# Patient Record
Sex: Male | Born: 2000 | Race: Black or African American | Hispanic: No | Marital: Single | Smoking: Never smoker
Health system: Southern US, Community
[De-identification: ages and names within clinical notes are randomized; demographics above are authoritative.]

## PROBLEM LIST (undated history)

## (undated) DIAGNOSIS — J45909 Unspecified asthma, uncomplicated: Secondary | ICD-10-CM

## (undated) HISTORY — PX: TESTICLE SURGERY: SHX794

---
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
## 2021-02-10 ENCOUNTER — Other Ambulatory Visit: Payer: Self-pay

## 2021-02-10 ENCOUNTER — Other Ambulatory Visit (HOSPITAL_COMMUNITY)
Admission: RE | Admit: 2021-02-10 | Discharge: 2021-02-10 | Disposition: A | Discharge status: Home / Self Care | Payer: Medicaid Other | Source: Ambulatory Visit | Attending: Pediatrics | Admitting: Pediatrics

## 2021-02-10 ENCOUNTER — Ambulatory Visit (INDEPENDENT_AMBULATORY_CARE_PROVIDER_SITE_OTHER): Payer: Medicaid Other | Admitting: Pediatrics

## 2021-02-10 VITALS — Temp 98.1°F | Wt 167.0 lb

## 2021-02-10 DIAGNOSIS — R1030 Lower abdominal pain, unspecified: Secondary | ICD-10-CM

## 2021-02-10 DIAGNOSIS — N5312 Painful ejaculation: Secondary | ICD-10-CM

## 2021-02-10 LAB — POCT URINALYSIS DIPSTICK
Bilirubin, UA: NEGATIVE
Blood, UA: NEGATIVE
Glucose, UA: NEGATIVE
Ketones, UA: NEGATIVE
Leukocytes, UA: NEGATIVE
Nitrite, UA: NEGATIVE
Protein, UA: POSITIVE — AB
Spec Grav, UA: 1.02 (ref 1.010–1.025)
Urobilinogen, UA: 0.2 E.U./dL
pH, UA: 7 (ref 5.0–8.0)

## 2021-02-10 NOTE — Progress Notes (Signed)
PCP: Hanvey, [place], MD   CC:  Penile pain   History was provided by the patient.   Subjective:  HPI:  Joshua Todd is a 20 y.o. male with a history of possible auditory processing disorder, ADHD, mild intermittent asthma who comes today for penile pain  The patient noticed pain in his penis with sex/ejaculation for the past 2 weeks.  His partner has also been complaining of pain with sex.  His last sexual intercourse was yesterday and was painful.  He has had mild pain when not having sex and only once during urination He denies discharge from penis Denies lesions sores or ulcers on penis Reports having penile-vaginal sex only Denies any trauma to the penis No previous history of STDs in the past Has had 2 partners over the past 1 month Not using condoms and reports that he would not be upset if his partner got pregnant Otherwise well  Of note, Has no showed most recent visits (Nov 21 with adolescent, Dec 21 with [facility])  REVIEW OF SYSTEMS: 10 systems reviewed and negative except as per HPI  Meds: Current Outpatient Medications  Medication Sig Dispense Refill  . albuterol (PROAIR HFA) 108 (90 Base) MCG/ACT inhaler Inhale 2 puffs into the lungs every 4 (four) hours as needed for wheezing or shortness of breath. 18 g 3  . cetirizine (ZYRTEC) 10 MG tablet Take 1 tablet (10 mg total) by mouth daily. 90 tablet 3  . fluticasone (FLONASE) 50 MCG/ACT nasal spray PLACE 1 SPRAY INTO BOTH NOSTRILS DAILY. 16 mL 1  . hydrOXYzine (ATARAX/VISTARIL) 10 MG tablet Take 1 tablet (10 mg total) by mouth 3 (three) times daily as needed. 30 tablet 0  . methylphenidate (CONCERTA) 36 MG PO CR tablet Take 1 tablet (36 mg total) by mouth daily. qam 31 tablet 0  . methylphenidate (CONCERTA) 36 MG PO CR tablet Take 1 tablet (36 mg total) by mouth daily. 30 tablet 0  . methylphenidate (CONCERTA) 36 MG PO CR tablet Take 1 tablet (36 mg total) by mouth daily. 31 tablet 0  . Multiple Vitamin tablet Take 1 tablet  by mouth daily. 30 tablet 12   No current facility-administered medications for this visit.    ALLERGIES: No Known Allergies  PMH: No past medical history on file.  Problem List:  Patient Active Problem List   Diagnosis Date Noted  . Sleep concern 08/03/2020  . Ringing in ears, bilateral 08/03/2020  . BMI (body mass index), pediatric, 5% to less than 85% for age 03/03/2020  . Central auditory processing disorder 08/03/2020  . Failed hearing screening 08/03/2020  . Vaccine refused by parent 08/03/2020  . Impacted cerumen of right ear 05/03/2018  . Mild intermittent asthma without complication 05/03/2018  . Seasonal allergic rhinitis 05/03/2018  . Adjustment disorder with mixed anxiety and depressed mood 02/14/2017  . Acne 06/02/2016  . Constipation 03/13/2014  . ADHD (attention deficit hyperactivity disorder), combined type 08/13/2013  . Learning disability 08/13/2013  . 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  Social history:  Social History   Social History Narrative  . Not on file    Family history: No family history on file.   Objective:   Physical Examination:  Temp: 98.1 F (36.7 C) (Temporal) Wt: 167 lb (75.8 kg)  GENERAL: Well appearing, no distress GU: Normal penis and testicles, no lesions, ulcers, discharges or sores SKIN: No rash  Urine dipstick: positive for protein,  otherwise negative for nitrate, leukocyte, blood Urine GC and chlamydia pending  Assessment:  Joshua Todd is a 20 y.o. old male here with dyspareunia.  Exam is normal urine dipstick is normal.  Most common etiology would be STD, urine GC/chlamydia are pending.   Plan:   1.Dyspareunia -Urine GC and chlamydia pending and most likely etiologies.  If negative, then will consider other etiologies -Given many condoms today and reviewed importance of use for both STD and pregnancy prevention  Follow up: as  needed, will call patient with pending results to number [phone]   Renato Gails, MD [facility] 02/10/2021  7:30 PM

## 2021-02-12 LAB — URINE CYTOLOGY ANCILLARY ONLY
Chlamydia: NEGATIVE
Comment: NEGATIVE
Comment: NORMAL
Neisseria Gonorrhea: NEGATIVE

## 2021-02-13 NOTE — Addendum Note (Signed)
Addended by: Roxy Horseman on: 02/13/2021 01:32 PM   Modules accepted: Orders

## 2021-02-13 NOTE — Progress Notes (Signed)
On 3/11 the patient was updated by phone re the negative gonorrhea and chlamydia results.  Also of note UA was negative for blood, nitrites and leukocytes.  Positive for protein, but obtained late in afternoon (will need first AM urine in future to confirm that he does not have persistent proteinuria. Patient is still having pain with ejaculation/intercourse.  Will plan to refer to urology.  The patient is aware and agrees with this plan.  He noted that his mom is also aware (in case clinic calls to schedule) Joshua Blanco MD

## 2021-03-19 ENCOUNTER — Encounter: Payer: Self-pay | Admitting: Developmental - Behavioral Pediatrics

## 2021-04-03 ENCOUNTER — Encounter: Payer: Self-pay | Admitting: Pediatrics

## 2021-04-03 ENCOUNTER — Other Ambulatory Visit: Payer: Self-pay

## 2021-04-03 ENCOUNTER — Ambulatory Visit (INDEPENDENT_AMBULATORY_CARE_PROVIDER_SITE_OTHER): Payer: Medicaid Other | Admitting: Pediatrics

## 2021-04-03 VITALS — BP 110/60 | HR 62 | Temp 97.0°F | Ht 72.0 in | Wt 166.6 lb

## 2021-04-03 DIAGNOSIS — F902 Attention-deficit hyperactivity disorder, combined type: Secondary | ICD-10-CM | POA: Diagnosis not present

## 2021-04-03 DIAGNOSIS — R4689 Other symptoms and signs involving appearance and behavior: Secondary | ICD-10-CM

## 2021-04-03 DIAGNOSIS — F4323 Adjustment disorder with mixed anxiety and depressed mood: Secondary | ICD-10-CM | POA: Diagnosis not present

## 2021-04-03 DIAGNOSIS — Z7689 Persons encountering health services in other specified circumstances: Secondary | ICD-10-CM

## 2021-04-03 DIAGNOSIS — H9325 Central auditory processing disorder: Secondary | ICD-10-CM

## 2021-04-03 DIAGNOSIS — F819 Developmental disorder of scholastic skills, unspecified: Secondary | ICD-10-CM | POA: Diagnosis not present

## 2021-04-03 DIAGNOSIS — R809 Proteinuria, unspecified: Secondary | ICD-10-CM

## 2021-04-03 DIAGNOSIS — J302 Other seasonal allergic rhinitis: Secondary | ICD-10-CM

## 2021-04-03 NOTE — Patient Instructions (Addendum)
[facility]  [email or web address]  [facility]  [email or web address]  [facility] [employer]  [email or web address]   Early morning urine: - Pee before bed.  Completely empty your bladder. - When you wake up, immediately go to the bathroom and pee in the sterile urine cup we gave you.  Close the lid tightly.  - Bring to clinic and deliver to front desk right away.   If you can't come right away, you can put it into the fridge for 4 hours.  After that, the sample is no longer good.
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
## 2021-04-04 DIAGNOSIS — R809 Proteinuria, unspecified: Secondary | ICD-10-CM | POA: Insufficient documentation

## 2021-04-04 DIAGNOSIS — R4689 Other symptoms and signs involving appearance and behavior: Secondary | ICD-10-CM | POA: Insufficient documentation

## 2021-04-04 MED ORDER — FLUTICASONE PROPIONATE 50 MCG/ACT NA SUSP: 1.0000 | Freq: Every day | NASAL | 1 refills | Status: DC

## 2021-04-04 MED ORDER — CETIRIZINE HCL 10 MG PO TABS: 10.0000 mg | ORAL_TABLET | Freq: Every day | ORAL | 3 refills | Status: DC

## 2021-06-27 ENCOUNTER — Other Ambulatory Visit: Payer: Self-pay | Admitting: Pediatrics

## 2021-06-27 DIAGNOSIS — J302 Other seasonal allergic rhinitis: Secondary | ICD-10-CM

## 2021-07-02 ENCOUNTER — Other Ambulatory Visit: Payer: Self-pay

## 2021-07-02 ENCOUNTER — Emergency Department (HOSPITAL_COMMUNITY): Payer: Medicaid Other

## 2021-07-02 ENCOUNTER — Emergency Department (HOSPITAL_COMMUNITY)
Admission: EM | Admit: 2021-07-02 | Discharge: 2021-07-03 | Disposition: A | Discharge status: Home / Self Care | Payer: Medicaid Other | Attending: Emergency Medicine | Admitting: Emergency Medicine

## 2021-07-02 DIAGNOSIS — Z23 Encounter for immunization: Secondary | ICD-10-CM | POA: Insufficient documentation

## 2021-07-02 DIAGNOSIS — S6291XA Unspecified fracture of right wrist and hand, initial encounter for closed fracture: Secondary | ICD-10-CM | POA: Insufficient documentation

## 2021-07-02 DIAGNOSIS — M7989 Other specified soft tissue disorders: Secondary | ICD-10-CM | POA: Diagnosis not present

## 2021-07-02 DIAGNOSIS — S6991XA Unspecified injury of right wrist, hand and finger(s), initial encounter: Secondary | ICD-10-CM | POA: Diagnosis present

## 2021-07-02 DIAGNOSIS — S62101A Fracture of unspecified carpal bone, right wrist, initial encounter for closed fracture: Secondary | ICD-10-CM

## 2021-07-02 DIAGNOSIS — J4520 Mild intermittent asthma, uncomplicated: Secondary | ICD-10-CM | POA: Insufficient documentation

## 2021-07-02 DIAGNOSIS — S62111A Displaced fracture of triquetrum [cuneiform] bone, right wrist, initial encounter for closed fracture: Secondary | ICD-10-CM | POA: Diagnosis not present

## 2021-07-02 NOTE — ED Triage Notes (Signed)
Patient here after a fall last week.  Patient states that he fell while skateboarding.  Patient has a wound on the palm of his hand.  Patient does have hand swelling and patient tried to bandage hand at home last week.  No drainage from wound at this time.

## 2021-07-03 ENCOUNTER — Emergency Department (HOSPITAL_COMMUNITY): Payer: Medicaid Other

## 2021-07-03 DIAGNOSIS — S62111A Displaced fracture of triquetrum [cuneiform] bone, right wrist, initial encounter for closed fracture: Secondary | ICD-10-CM | POA: Diagnosis not present

## 2021-07-03 DIAGNOSIS — S6291XA Unspecified fracture of right wrist and hand, initial encounter for closed fracture: Secondary | ICD-10-CM | POA: Diagnosis not present

## 2021-07-03 MED ORDER — DOXYCYCLINE HYCLATE 100 MG PO TABS
100.0000 mg | ORAL_TABLET | Freq: Once | ORAL | Status: AC
  Administered 2021-07-03: 100 mg via ORAL
  Filled 2021-07-03: qty 1

## 2021-07-03 MED ORDER — DOXYCYCLINE HYCLATE 100 MG PO CAPS: 100.0000 mg | ORAL_CAPSULE | Freq: Two times a day (BID) | ORAL | 0 refills | Status: DC

## 2021-07-03 MED ORDER — TETANUS-DIPHTH-ACELL PERTUSSIS 5-2.5-18.5 LF-MCG/0.5 IM SUSY
0.5000 mL | PREFILLED_SYRINGE | Freq: Once | INTRAMUSCULAR | Status: AC
  Administered 2021-07-03: 0.5 mL via INTRAMUSCULAR
  Filled 2021-07-03: qty 0.5
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
## 2021-07-03 NOTE — Discharge Instructions (Addendum)
Follow up with ortho in the office.   Return for rapid spreading redness, fever

## 2021-07-03 NOTE — Progress Notes (Signed)
Orthopedic Tech Progress Note Patient Details:  Joshua Todd [birth year] [id number]  Ortho Devices Type of Ortho Device: Sugartong splint, Shoulder immobilizer Ortho Device/Splint Location: RUE Ortho Device/Splint Interventions: Ordered, Application, Adjustment   Post Interventions Patient Tolerated: Well Instructions Provided: Adjustment of device, Care of device, Poper ambulation with device  [name] 07/03/2021, 5:12 AM

## 2021-07-06 ENCOUNTER — Telehealth: Payer: Self-pay

## 2021-07-06 NOTE — Telephone Encounter (Signed)
Transition Care Management Unsuccessful Follow-up Telephone Call  Date of discharge and from where:  07/03/2021-[facility]  Attempts:  1st Attempt  Reason for unsuccessful TCM follow-up call:  Unable to reach patient

## 2021-07-07 NOTE — Telephone Encounter (Signed)
Transition Care Management Unsuccessful Follow-up Telephone Call  Date of discharge and from where:  07/03/2021-[facility]   Attempts:  2nd Attempt  Reason for unsuccessful TCM follow-up call:  Voice mail full

## 2021-07-08 NOTE — Telephone Encounter (Signed)
Transition Care Management Unsuccessful Follow-up Telephone Call  Date of discharge and from where:  07/03/2021-[facility]  Attempts:  3rd Attempt  Reason for unsuccessful TCM follow-up call:  Voice mail full    

## 2021-08-07 ENCOUNTER — Other Ambulatory Visit: Payer: Self-pay

## 2021-08-07 ENCOUNTER — Other Ambulatory Visit (HOSPITAL_COMMUNITY)
Admission: RE | Admit: 2021-08-07 | Discharge: 2021-08-07 | Disposition: A | Discharge status: Home / Self Care | Payer: Medicaid Other | Source: Ambulatory Visit | Attending: Pediatrics | Admitting: Pediatrics

## 2021-08-07 ENCOUNTER — Ambulatory Visit (INDEPENDENT_AMBULATORY_CARE_PROVIDER_SITE_OTHER): Payer: Medicaid Other | Admitting: Pediatrics

## 2021-08-07 ENCOUNTER — Encounter: Payer: Self-pay | Admitting: Pediatrics

## 2021-08-07 VITALS — BP 98/58 | HR 63 | Ht 71.75 in | Wt 161.2 lb

## 2021-08-07 DIAGNOSIS — Z658 Other specified problems related to psychosocial circumstances: Secondary | ICD-10-CM

## 2021-08-07 DIAGNOSIS — F819 Developmental disorder of scholastic skills, unspecified: Secondary | ICD-10-CM | POA: Diagnosis not present

## 2021-08-07 DIAGNOSIS — Z113 Encounter for screening for infections with a predominantly sexual mode of transmission: Secondary | ICD-10-CM | POA: Insufficient documentation

## 2021-08-07 DIAGNOSIS — Z0001 Encounter for general adult medical examination with abnormal findings: Secondary | ICD-10-CM | POA: Diagnosis not present

## 2021-08-07 DIAGNOSIS — S62164G Nondisplaced fracture of pisiform, right wrist, subsequent encounter for fracture with delayed healing: Secondary | ICD-10-CM

## 2021-08-07 DIAGNOSIS — Z68.41 Body mass index (BMI) pediatric, 5th percentile to less than 85th percentile for age: Secondary | ICD-10-CM | POA: Diagnosis not present

## 2021-08-07 DIAGNOSIS — L02413 Cutaneous abscess of right upper limb: Secondary | ICD-10-CM

## 2021-08-07 DIAGNOSIS — Z114 Encounter for screening for human immunodeficiency virus [HIV]: Secondary | ICD-10-CM

## 2021-08-07 DIAGNOSIS — R809 Proteinuria, unspecified: Secondary | ICD-10-CM | POA: Diagnosis not present

## 2021-08-07 DIAGNOSIS — F902 Attention-deficit hyperactivity disorder, combined type: Secondary | ICD-10-CM | POA: Diagnosis not present

## 2021-08-07 DIAGNOSIS — J302 Other seasonal allergic rhinitis: Secondary | ICD-10-CM | POA: Diagnosis not present

## 2021-08-07 DIAGNOSIS — J4520 Mild intermittent asthma, uncomplicated: Secondary | ICD-10-CM | POA: Diagnosis not present

## 2021-08-07 DIAGNOSIS — S62151G Displaced fracture of hook process of hamate [unciform] bone, right wrist, subsequent encounter for fracture with delayed healing: Secondary | ICD-10-CM

## 2021-08-07 DIAGNOSIS — Z00121 Encounter for routine child health examination with abnormal findings: Secondary | ICD-10-CM

## 2021-08-07 DIAGNOSIS — Z7689 Persons encountering health services in other specified circumstances: Secondary | ICD-10-CM

## 2021-08-07 LAB — POCT RAPID HIV: Rapid HIV, POC: NEGATIVE

## 2021-08-07 MED ORDER — CLINDAMYCIN HCL 150 MG PO CAPS: 150.0000 mg | ORAL_CAPSULE | Freq: Three times a day (TID) | ORAL | 0 refills | Status: AC

## 2021-08-07 MED ORDER — METHYLPHENIDATE HCL ER (OSM) 36 MG PO TBCR: 36.0000 mg | EXTENDED_RELEASE_TABLET | Freq: Every day | ORAL | 0 refills | Status: DC

## 2021-08-07 MED ORDER — CLINDAMYCIN HCL 300 MG PO CAPS: 300.0000 mg | ORAL_CAPSULE | Freq: Three times a day (TID) | ORAL | 0 refills | Status: AC

## 2021-08-07 NOTE — Patient Instructions (Addendum)
Thanks for letting me take care of you and your family.  It was a pleasure seeing you today.  Here's what we discussed:  We will restart ADHD medications today.  We will have you follow-up in one month to make sure you are tolerating this medication without side effects.  Once we are on a stable dose, we will follow-up every 3 months.   I will send an antibiotic to your pharmacy for possible skin infection.  Please start this antibiotic today.  You will take both the 150 mg capsule and the 300 mg capsule for a combined dose of 450 mg.  Take this medication THREE times per day (about every 8 hours) for FIVE days.  Do not stop the medication early.  We will have you follow-up next week for recheck.   I will send a referral to an orthopedic hand surgeon.  Joshua Todd may benefit from physical therapy but I would like to make sure there is no additional inflammation/infection before we start this.    Please call our office if you have any new fever over 101F.

## 2021-08-07 NOTE — Progress Notes (Signed)
Adolescent Well Care Visit Joshua Todd is a 20 y.o. male who is here for well care.     PCP:  Lafern Brinkley, [place], MD   History was provided by the patient and mother.   Confidentiality was discussed with the patient and, if applicable, with caregiver.  Patient's personal phone number: [phone]   Current Issues:  R wrist fracture and hand laceration - Patient states that he sustained fall off skateboard 07/02/21 and that he went to ED and they started him on medication, but "hand has gotten worse again."  Reviewed ED visit notes 7/28 (sensitive note, had to "break the glass" for patient care) - exam notable for pain and swelling to R hand w/two lacerations (old in appearance) over palmar aspect fourth and fifth digit.  Plain film with concern for triquetrum and hamate fx.  "Discussed with Dr. Dion SaucierLandau, recommends splint, CT and will follow-up in the office."   - CT right wrist showed "severely comminuted fracture of the distal aspect of the pisiform with multiple 1-2 mm fracture fragments.  A few small fracture fragments project distal from the fracture in the soft tissues at the palmar aspect of the fifth metacarpal base.  Minimally displaced fracture of the hook of the hamate." - Given TDaP and started on doxycycline 100 mg BID to complete 7-day course.  - Per Mom, he "refused to follow-up with Ortho."   Refused to come see PCP.    Chronic Conditions:  History of proteinuria in March 2022.  Plan was for first morning sample, but never returned with provided urine cup.  Denies any recent urine changes.  No gross blood in urine.   Urine collected today at end of visit.  UA requested but not completed (only provided non clean-catch sample for urine GC/CT).   Mild intermittent asthma - well controlled with PRN albuterol use.  Has not used albuterol in a long time.   Allergic rhinitis - managed on Flonase, cetirizine. Prev advised to increase cetirizine to BID for next 1-2 mo and then return  to daily dosing and Flonase to BID.  Needs refills.   Learning disability - graduated in Spring 2022. Received specialized instruction in math and reading.  Wanted to join [employer]ines but never applied.  Interested in employment resources.  No plans to attend any post-secondary education.   Constipation - well-controlled   CAPD   Aggressive behavior   ADHD, combined type - Not currently on any medication.  Prev managed on Concerta 36 mg daily.  Followed by Adolescent team.  Concerta last prescribed in Aug 2021 (5141-month supply), but family says they never picked up the Rx.    Mood concern - Previously encouraged him to get involved with some type of healthy social teen group after graduation to avoid isolation.  Consulted with social work and behavioral health -- but did not identify resource for men 18 years and up.  Sent link to [employer] summer programming, but he never looked into these options.    Nutrition: Nutrition/Eating Behaviors: enormous appetite  Adequate calcium in diet?: yes Supplements/ Vitamins: no  Exercise/ Media: Play any Sports?:  none Exercise:   works out at gym multiple times per week - previously lifting weights but less since hand injury  Screen Time:   well over 2 hours per day -- counseling provided.  Has own phone.    Sleep:  Sleep: 8-10 hours/day, bedtime by 10 pm. Sometimes sleeps in afternoons - counseling provided  Sleep apnea symptoms:  no   Social Screening: Lives with: mother Parental relations:   issues regarding limit setting; gets angry easily with Mom but she helps him calm and take a step back; no physical aggression towards mom  Activities, Work, and Chores?: Mom has trouble getting Joshua Todd to help with chores at home.  Interested in employment resources [employer] only partially interested)  Concerns regarding behavior with peers?  Prior concerns for easy aggression with peers.  No current peer group.  Does stay in touch some with  older sister who lives locally in the [place] area  Education: School name: graduated [employer] in 2022  No plans for post-secondary education   Menstruation:   Not applicable   Dental Assessment: Patient has a dental home: yes - due for follow-up   Confidential social history: Tobacco?  no Secondhand smoke exposure?  no Drugs/ETOH?  no  Sexually Active?  yes   Pregnancy Prevention: intermittent condom use   Safe at home, in school & in relationships? Prior concern for aggressive behavior with peers; easily frustrated  Safe to self?  Yes  Screenings:  The patient completed the Rapid Assessment for Adolescent Preventive Services screening questionnaire and the following topics were identified as risk factors and discussed: condom use, mental health issues, social isolation, and anger   In addition, the following topics were discussed as part of anticipatory guidance: pregnancy prevention, depression/anxiety.  PHQ-9 completed and results indicated score 6 - concern for mild depression.  Symptoms make it "extremely difficult."   Physical Exam:  Vitals:   08/07/21 1416  BP: (!) 98/58  Pulse: 63  SpO2: 95%  Weight: 161 lb 3.2 oz (73.1 kg)  Height: 5' 11.75" (1.822 m)   BP (!) 98/58 (BP Location: Left Arm, Patient Position: Sitting, Cuff Size: Normal) Comment (BP Location): right arm in pain  Pulse 63   Ht 5' 11.75" (1.822 m)   Wt 161 lb 3.2 oz (73.1 kg)   SpO2 95%   BMI 22.02 kg/m  Body mass index: body mass index is 22.02 kg/m. Blood pressure percentiles are not available for patients who are 18 years or older.  Hearing Screening  Method: Audiometry   500Hz  1000Hz  2000Hz  4000Hz   Right ear 20 20 20 20   Left ear 20 20 20 20    Vision Screening   Right eye Left eye Both eyes  Without correction 20/20 20/20   With correction       General: well developed, no acute distress, gait normal, intermittent eye contact, quiet speech, limited interaction  HEENT: PERRL,  normal oropharynx, TMs normal bilaterally Neck: supple, no lymphadenopathy CV: RRR no murmur noted PULM: normal aeration throughout all lung fields, no crackles or wheezes Abdomen: soft, non-tender; no masses or HSM Extremities: warm and well perfused Right hand - Mild erythema at sites of two prior lacerations over palm -- no drainage or fluctuance.  Flinches with gentle touch and palpation of ulnar aspect and palmar surface (ulnar > left).  States that he "flinches" because he doesn't like the "feeling of other people's touch on his hand" but that it is "not painful."  Unable to fully MCP and IP joints.  Unable to fully flex MCP and IP joints to form fist.  Decreased strength and ROM with thumb abduction and opposition.    Skin - small dime-sized area of erythema and fluctuance over ventral surface of distal right forearm.  No streaking erythema towards wrist.  Warm and tender to palpation. GU: Normal male external genitalia and Testes  descended bilaterally. Exam completed with chaperone present.  Skin:scattered comedones Neuro: alert and oriented, moves all extremities equally   Assessment and Plan:  Dante Roudebush is a 20 y.o. male who is here for well care.   Encounter for routine child health examination with abnormal findings  BMI (body mass index), pediatric, 5% to less than 85% for age  Mild intermittent asthma without complication Well controlled.  Provided refills.  -     albuterol (PROAIR HFA) 108 (90 Base) MCG/ACT inhaler; Inhale 2 puffs into the lungs every 4 (four) hours as needed for wheezing or shortness of breath.  Seasonal allergies Well controlled.  Provided refills.  -     cetirizine (ZYRTEC) 10 MG tablet; Take 1 tablet (10 mg total) by mouth daily. -     fluticasone (FLONASE) 50 MCG/ACT nasal spray; Place 1 spray into both nostrils daily as needed for allergies or rhinitis.  ADHD (attention deficit hyperactivity disorder), combined type Concern for  poorly-controlled impulsive behavior. Mom and patient both requesting ADHD meds.  Will plan to restart ADHD medications at prior dose with close 1-mo follow-up to ensure tolerating medication with good effect.  Provided 1 mo supply.   -     methylphenidate (CONCERTA) 36 MG PO CR tablet; Take 1 tablet (36 mg total) by mouth daily.  Learning disability Completed HS.  Discussed postsecondary education today, including vocational training, but patient not interested.   Proteinuria, unspecified type Unable to collect clean catch urine today for repeat UA to assess for persistent proteinuria.  Recheck at f/u visit next week.    Cutaneous abscess of right upper extremity Concern for small abscess over ventral surface of R distal forearm.  Will treat with clindamycin TID for 5-day course w/close follow-up planned for Mon 9/6.  BP slightly low for age, but concern for sepsis low in absence of fever and other systemic symptoms.  Exam is also notable for prior two hand lacerations over palmar surface which have mild erythema.  No streaking erythema to suggest lymphangiitis.   -     clindamycin (CLEOCIN) 300 MG capsule; Take 1 capsule (300 mg total) by mouth 3 (three) times daily for 5 days. Take with 150 mg capsule for combined dose of 450 mg. - Recheck Mon 9/6  - Attempted to reach patient after visit at preferred number to ensure he had picked up medication and started it.  No answer and unable to leave VM.  Called another number previously listed as personal phone number-- no answer.    Closed nondisplaced fracture of pisiform of right wrist with delayed healing, subsequent encounter Closed displaced fracture of hook process of hamate of right wrist with delayed healing, subsequent encounter Concern for inappropriate healing and resulting decreased hand function.  Recommend urgent follow-up with Hand Surgery (patient previously refused Ortho follow-up after ED visit).  Emphasized importance of this  follow-up and long-term consequences if not treated.  -     Ambulatory referral to Orthopedics - Hand Surgery   Psychosocial stressors  Interested in employment resources.  Will route to [employer] to follow up with family  Sleep concern Increased need for sleep likely related to poorly controlled depression or other mood disorder.  - Did not discuss at length due to multiple other concerns today  - Declines referral to behavioral health   Well teen: -Growth: BMI is appropriate for age - downtrending in setting of intentional weight loss  -Development: prior concern for learning disability   -Social-Emotional: history  and PHQ-9A concerning for mild depression - declines referral to behavioral health  -Discussed anticipatory guidance including pregnancy/STI prevention, alcohol/drug use, screen time limits -Hearing screening result:normal - history of CAPD -Vision screening result: normal -STI screening completed -Blood pressure: slightly low for age - no concern for sepsis today  -Discuss transition to family medicine at next visit.  Multiple concerns today requiring follow-up with me -- not discussed   Need for vaccination:  -Counseling provided for all vaccine components  Due for Menactra #2 today.  Out of stock.  Provide at 1 mo f/u for ADHD.   Previously declined Mening B.  Had intended to join marines, but no longer interested. Recommend if new interest in [employer] or dormitory living.    Return for f/u 9/5 or 9/6 for hand recheck - yellow pod ok; 1 mo ADHD/mood f/u me -30 min .Marland Kitchen  Enis Gash, MD [facility]

## 2021-08-11 ENCOUNTER — Ambulatory Visit: Payer: Medicaid Other

## 2021-08-11 LAB — URINE CYTOLOGY ANCILLARY ONLY
Chlamydia: NEGATIVE
Comment: NEGATIVE
Comment: NORMAL
Neisseria Gonorrhea: NEGATIVE

## 2021-08-15 DIAGNOSIS — S62164A Nondisplaced fracture of pisiform, right wrist, initial encounter for closed fracture: Secondary | ICD-10-CM | POA: Insufficient documentation

## 2021-08-15 DIAGNOSIS — S62151G Displaced fracture of hook process of hamate [unciform] bone, right wrist, subsequent encounter for fracture with delayed healing: Secondary | ICD-10-CM | POA: Insufficient documentation

## 2021-08-15 MED ORDER — ALBUTEROL SULFATE HFA 108 (90 BASE) MCG/ACT IN AERS: 2.0000 | INHALATION_SPRAY | RESPIRATORY_TRACT | 1 refills | Status: AC | PRN

## 2021-08-15 MED ORDER — FLUTICASONE PROPIONATE 50 MCG/ACT NA SUSP: 1.0000 | Freq: Every day | NASAL | 2 refills | Status: DC | PRN

## 2021-08-15 MED ORDER — CETIRIZINE HCL 10 MG PO TABS
10.0000 mg | ORAL_TABLET | Freq: Every day | ORAL | 3 refills | Status: AC
Start: 2021-08-15 — End: ?

## 2021-08-17 ENCOUNTER — Telehealth: Payer: Self-pay

## 2021-08-17 DIAGNOSIS — Z09 Encounter for follow-up examination after completed treatment for conditions other than malignant neoplasm: Secondary | ICD-10-CM

## 2021-08-17 NOTE — Telephone Encounter (Signed)
SWCM called mother, left VM with message that mychart message with workforce resources was sent. Left call back number for mother to call back if needed.    Kenn File, BSW, QP Case Manager [employer] [facility] [employer] for Child and Adolescent Health Office: [phone] Direct Number: [phone]

## 2021-09-24 ENCOUNTER — Telehealth: Payer: Self-pay | Admitting: Pediatrics

## 2021-09-24 DIAGNOSIS — S62151G Displaced fracture of hook process of hamate [unciform] bone, right wrist, subsequent encounter for fracture with delayed healing: Secondary | ICD-10-CM

## 2021-09-24 DIAGNOSIS — S62164G Nondisplaced fracture of pisiform, right wrist, subsequent encounter for fracture with delayed healing: Secondary | ICD-10-CM

## 2021-09-24 NOTE — Telephone Encounter (Signed)
Mom called requesting a referral for PT services at [facility].

## 2021-09-25 NOTE — Telephone Encounter (Signed)
Spoke with mother by phone.  She is requesting referral for PT for his hand.   Patient with worsening hand function and pain after visit with me on 08/07/21.  Patient previously referred to Ortho Hand Surgery but missed appointment  on 9/16 with Delbert Harness (refused to go per Mom).  He is now willing to go.   Discussed that I would like his hand evaluated by hand surgery before we can initiate PT.  If they are in agreement with PT, then I am happy to place the referral.    New urgent referral to Ortho hand surgery placed.  Routing to [employer].    Enis Gash, MD [facility] Children

## 2021-10-01 NOTE — Patient Instructions (Addendum)
Your appointment at [facility] is 10/07/21.  Arrive by 9:30 AM.   Please call to let me know how this appointment goes and if you need a physical therapy referral.  You can also send me a MyChart message if this is easier for you.   As your medical provider, it is important to me that you continue to receive high-quality primary care services as you transition to adulthood.  Below is a list of adult medicine practices that are currently accepting new patients.  Start looking over these with Mom.    Adult Primary Care Clinics Name Criteria Services   [facility]  Address: [address] [place], [place] [address]  Phone: [phone] Hours: Monday - Friday 9 AM -6 PM  Types of insurance accepted:  Commercial insurance [facility] (orange card) [employer] Uninsured  Language services:  Video and phone interpreters available   Ages 22 and older    Adult primary care Onsite pharmacy Integrated behavioral health Financial assistance counseling Walk-in hours for established patients  Financial assistance counseling hours: Tuesdays 2:00PM - 5:00PM  Thursday 8:30AM - 4:30PM  Space is limited, 10 on Tuesday and 20 on Thursday on a first come, first serve basis  Name Criteria Services   [facility]  Address: [address] [place], [place] [address]  Phone: [phone]  Hours: Monday - Friday 8:30 AM - 5 PM  Types of insurance accepted:  Nurse, learning disability Medicaid Medicare Uninsured  Language services:  Video and phone interpreters available   All ages - newborn to adult   Primary care for all ages (children and adults) Integrated behavioral health Nutritionist Financial assistance counseling   Name Criteria Services   [facility]  Located on the ground floor of [facility]  Address: [address]  [place],  [place]   [address]  Phone: [phone]  Hours: Monday - Friday 8:15 AM - 5 PM  Types of insurance accepted:  Commercial insurance Medicaid Medicare Uninsured  Language services:  Video and phone interpreters available   Ages 40 and older   Adult primary care Nutritionist Certified Diabetes Educator  Integrated behavioral health Financial assistance counseling   Name Criteria Services   [facility]  Address: [address] [place], [place] [address]  Phone: [phone]  Hours: Monday - Friday 8:30 AM - 5 PM    Types of insurance accepted:  Nurse, learning disability Medicaid Medicare Uninsured  Language services:  Video and phone interpreters available   All ages - newborn to adult   Primary care for all ages (children and adults) Integrated behavioral health Financial assistance counseling    Workforce Resources   I highly recommend you start by calling [employer].   [place] [employer] First   [phone] ([place]) [phone] ([place]) [employer] [phone] [facility] Employment Works [phone] [employer] Job and [employer] [phone] [facility] Employment Center  [phone] [facility] [phone] [facility] Quick Jobs [phone] Job Link [phone] ([place]) [employer] Admissions and Career Transition [phone] [place] Division of Vocational Rehabilitation [phone] [facility] Employment Security Commission [phone] ([place]) Welfare Reform Liaison Project [phone] [facility] [phone] Vocational Rehabilitation Office [phone] ([place]) [place] [phone] [employer] 4356615468 [employer] [phone] [place] Works: Dr. Jacqulyn Bath, Regional Former Offender Specialist [phone] [employer] 6572450915 [employer] Website :  http://chang.info/ The Agency [phone] [facility] [employer] [phone] [employer] [phone] [employer] Service [phone] [facility] [phone] Leonette Most and [facility] [facility] [employer] [phone]

## 2021-10-01 NOTE — Progress Notes (Addendum)
Joshua Todd is here for follow up of ADHD   Concerns:   Here for ADHD follow-up.  Last seen for well visit on 9/2, at which time restarted Concerta 36 mg daily.    Has some nausea with medication, but thinks it is because he does not take it with food.  Eating about one meal per day.    He feels like medication is helping him stay more focused.  Mom says it is hard to tell a difference because he is not working or going to school.  He spends his day watching TV.  Has not reached out to apply for job, b/c his hand is injured.   Medication side effects---Review of Systems Eating Changes in appetite: yes - decreased   Other Psychiatric anxiety, depression, poor social interaction, obsessions, compulsive behaviors:  increased need for sleep, very limited social interaction   Cardiovascular Denies:  chest pain, irregular heartbeats, rapid heart rate, syncope, lightheadedness, dizziness: no  Headaches: no  Stomach aches: no  Tic(s): no    Hand injury  See prior visit note for details.  S/p closed fracture of right wrist on 7/28 now with decreased R hand function- did not f/u with ortho as instructed by ED and missed initial appt with [facility].    Social  Caseworker sent resources for Workforce and trade school resources.  He has not opened this MyChart message -- didn't have password.    Parental relations:   issues regarding limit setting Activities, Work, and Chores?: Mom has trouble getting Joshua Todd to help with chores at home.  Has not pursued employment due to hand.    Physical Examination   There were no vitals filed for this visit.  Wt Readings from Last 3 Encounters:  08/07/21 161 lb 3.2 oz (73.1 kg) (64 %, Z= 0.36)*  04/03/21 166 lb 9.6 oz (75.6 kg) (73 %, Z= 0.60)*  02/10/21 167 lb (75.8 kg) (74 %, Z= 0.64)*   * Growth percentiles are based on CDC (Boys, 2-20 Years) data.      Physical Exam Vitals and nursing note reviewed.  Constitutional:      Appearance:  Normal appearance.  HENT:     Nose: Nose normal. No congestion.     Mouth/Throat:     Mouth: Mucous membranes are moist.  Eyes:     Conjunctiva/sclera: Conjunctivae normal.  Cardiovascular:     Rate and Rhythm: Normal rate and regular rhythm.     Pulses: Normal pulses.     Heart sounds: No murmur heard. Pulmonary:     Effort: Pulmonary effort is normal.     Breath sounds: Normal breath sounds.  Musculoskeletal:     Cervical back: Normal range of motion and neck supple.     Comments: Numbness over R thumb and pointer finger.  Unable to fully extend Right MCP and IP joints.  Unable to fully flex R MCP and IP joints (esp R pointer finger) to form fist.  Hyperpigmentation over site of prior scars.   Skin:    General: Skin is warm and dry.     Capillary Refill: Capillary refill takes 2 to 3 seconds.  Neurological:     Mental Status: He is alert.   Parent vanderbilt (entered in Epic) Hyperactive score: 9  Inattentive score: 8  Assessment Teenage male with some improvement in attention and impulsivity per self-report after reinitiating Concerta 36 mg daily.  Parent Vanderbilt today concerning for poorly-controlled inattentive and hyperactive behaviors, but Mom acknowledges it is hard  to complete the form because Joshua Todd "only watches TV at home -- he has no structure or routine to his day."   BP and growth remain appropriate.  He is still working out to intentionally lose weight.  Given nausea (possible SE) and inability to accurately access effect, will defer dose increase.    Plan  ADHD (attention deficit hyperactivity disorder), combined type - Reviewed list of workforce and trade skill resources.  Patient to reach out to [facility] next week.   - Take medication in morning with a high-fat, high-protein meal  - Reach out ot me by phone or MyChart if worsening vomiting, nausea  - Refills provided per orders - 3 mo supply Concerta 36 mg daily  - F/u in 3 months.  Discussed transitioning  to adult care per below.   Meningococcal vaccination declined Consented to vaccine, but then declined at time of administration.  Still due for dose #2.  Discuss next visit.    Hand injury, s/p R wrist fracture  Reminded of ortho appt on 11/2.  He will reach out to me if PT referral is needed.  Set up MyChart password today in room.   Adolescent transition Skills covered during visit  Transition  self care assessment check list completed by youth and a scorable transition readiness assessment form has been reviewed : The following topics identified with learning needs:  I know my PCP office phone number - entered into phone.  2. I carry important med info with me - will create med list in notes app - identified app.   3. I know about the transition to adult care process -- described process and provided list of adult practices   After discussion with teen/young adult, s(he) is able to:  - Icannotexplain my healthcare needs   if any specialty care how to request a referral No  -I can list my allergies  Is medical alert discussed (as appropriate) not applicable  Medication(s): -I cannot name my medication(s), when and how to take, and side effects  -I know how to obtain may not understand  my medications from pharmacy No or  provider No  -I know my family history and have a phone app/written document to refer to No  Arrange care: -I know how to obtain urgent/emergency care Yes  -I understand how to arrange for transportation to my medical appts No  -I know how to make an appointment with my provider Yes  -I am taking responsibility for completing forms at medical visits Yes  -I understand HIPAA No -confidentiality during office visit No -full privacy when I turn 19 No  -I am ready to transition to adult care and have been provided with information No  Goals for next visit to address? Family med history, arranging transportation  The Teen completed a scorable self-care  assessment tool today.   Based on responses to "want to learn", we have reviewed/revised teens plan of care to address needed self-care skills including the following topics (see note above).   The Teen will begin to practice these skills with parental oversight.   Planned follow up for transition of healthcare will be addressed at next [facility] visit.  Patient given information about adolescent transition and above learning needs addressed today.    Time spent in pre-planning for visit today 5 minutes, review of assessment tool and education/discussion with teen has been for 5 additional minutes.   [place] B Esli Jernigan, MD

## 2021-10-02 ENCOUNTER — Ambulatory Visit (INDEPENDENT_AMBULATORY_CARE_PROVIDER_SITE_OTHER): Payer: Medicaid Other | Admitting: Pediatrics

## 2021-10-02 ENCOUNTER — Other Ambulatory Visit: Payer: Self-pay

## 2021-10-02 ENCOUNTER — Encounter: Payer: Self-pay | Admitting: Pediatrics

## 2021-10-02 VITALS — BP 118/64 | HR 58 | Ht 72.0 in | Wt 163.8 lb

## 2021-10-02 DIAGNOSIS — F902 Attention-deficit hyperactivity disorder, combined type: Secondary | ICD-10-CM | POA: Diagnosis not present

## 2021-10-02 DIAGNOSIS — Z2821 Immunization not carried out because of patient refusal: Secondary | ICD-10-CM | POA: Diagnosis not present

## 2021-10-02 MED ORDER — METHYLPHENIDATE HCL ER (OSM) 36 MG PO TBCR
36.0000 mg | EXTENDED_RELEASE_TABLET | Freq: Every day | ORAL | 0 refills | Status: DC
Start: 2021-11-01 — End: 2022-01-08

## 2021-10-02 MED ORDER — METHYLPHENIDATE HCL ER (OSM) 36 MG PO TBCR: 36.0000 mg | EXTENDED_RELEASE_TABLET | Freq: Every day | ORAL | 0 refills | Status: DC

## 2021-10-02 MED ORDER — METHYLPHENIDATE HCL ER (OSM) 36 MG PO TBCR
36.0000 mg | EXTENDED_RELEASE_TABLET | Freq: Every day | ORAL | 0 refills | Status: DC
Start: 2021-10-02 — End: 2022-01-08

## 2022-01-08 ENCOUNTER — Encounter: Payer: Self-pay | Admitting: Pediatrics

## 2022-01-08 ENCOUNTER — Ambulatory Visit (INDEPENDENT_AMBULATORY_CARE_PROVIDER_SITE_OTHER): Payer: Medicaid Other | Admitting: Pediatrics

## 2022-01-08 ENCOUNTER — Other Ambulatory Visit: Payer: Self-pay

## 2022-01-08 VITALS — BP 110/66 | HR 77 | Ht 72.0 in | Wt 163.2 lb

## 2022-01-08 DIAGNOSIS — Z5941 Food insecurity: Secondary | ICD-10-CM | POA: Diagnosis not present

## 2022-01-08 DIAGNOSIS — F902 Attention-deficit hyperactivity disorder, combined type: Secondary | ICD-10-CM | POA: Diagnosis not present

## 2022-01-08 DIAGNOSIS — S62151G Displaced fracture of hook process of hamate [unciform] bone, right wrist, subsequent encounter for fracture with delayed healing: Secondary | ICD-10-CM

## 2022-01-08 DIAGNOSIS — Z7689 Persons encountering health services in other specified circumstances: Secondary | ICD-10-CM

## 2022-01-08 MED ORDER — METHYLPHENIDATE HCL ER (OSM) 36 MG PO TBCR: 36.0000 mg | EXTENDED_RELEASE_TABLET | Freq: Every day | ORAL | 0 refills | Status: DC

## 2022-01-08 NOTE — Patient Instructions (Addendum)
Thanks for letting me take care of you and your family.  It was a pleasure seeing you today.  Here's what we discussed:  Continue Concerta 36 mg.  Always take with food.    I will send a referral to [facility].  I have included their number below so you can call in 1-2 weeks to check on there referral and schedule your next appointment with them.  If they cannot see you for more than 3 months, then you can make one more visit here for ADHD follow-up.   [facility]: [phone]    Adult Primary Care Clinics Name Criteria Services   [facility]  Address: [address] [place], [place] [address]  Phone: [phone] Hours: Monday - Friday 9 AM -6 PM  Types of insurance accepted:  Commercial insurance [facility] (orange card) [employer] Uninsured  Language services:  Video and phone interpreters available   Ages 86 and older    Adult primary care Onsite pharmacy Integrated behavioral health Financial assistance counseling Walk-in hours for established patients  Financial assistance counseling hours: Tuesdays 2:00PM - 5:00PM  Thursday 8:30AM - 4:30PM  Space is limited, 10 on Tuesday and 20 on Thursday on a first come, first serve basis  Name Criteria Services   [facility]  Address: [address] [place], [place] [address]  Phone: [phone]  Hours: Monday - Friday 8:30 AM - 5 PM  Types of insurance accepted:  Nurse, learning disability Medicaid Medicare Uninsured  Language services:  Video and phone interpreters available   All ages - newborn to adult   Primary care for all ages (children and adults) Integrated behavioral health Nutritionist Financial assistance counseling   Name Criteria Services   [facility]  Located on the ground floor of [facility]  Address: [address]   [place],  [place]  [address]  Phone: [phone]  Hours: Monday - Friday 8:15 AM - 5 PM  Types of insurance accepted:  Commercial insurance Medicaid Medicare Uninsured  Language services:  Video and phone interpreters available   Ages 12 and older   Adult primary care Nutritionist Certified Diabetes Educator  Integrated behavioral health Financial assistance counseling   Name Criteria Services   [facility]  Address: [address] [place], [place] [address]  Phone: [phone]  Hours: Monday - Friday 8:30 AM - 5 PM    Types of insurance accepted:  Nurse, learning disability Medicaid Medicare Uninsured  Language services:  Video and phone interpreters available   All ages - newborn to adult   Primary care for all ages (children and adults) Integrated behavioral health Financial assistance counseling

## 2022-01-08 NOTE — Progress Notes (Signed)
PCP: Joshua Todd, [place], MD   Chief Complaint  Patient presents with   Well Child    Subjective:  HPI:  Joshua Todd is a 21 y.o. male here for ADHD f/u.  Also discussed transition to adult medicine today.   Last seen 10/28 at which time continued on Concerta 36 mg daily.  He feels like the medication is especially helpful to keep him focused at work.    Medication side effects---Review of Systems Sleep - no issues, falls asleep easily   Eating - sometimes has stomachache, but resolves with eating. Finishes meals.    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  Would like assistance accessing food stamps.  - Also requesting assistance with housing - living with "baby mama" and her family.  Hoping to rent.   Hand injury, s/p R wrist fracture - did not attend [facility] app[facility] (had been rescheduled to 11/12 after no-show).  Joshua Todd says she had address mixed up.  Joshua Todd would still like to be seen -- limited motion of right hand.  Requesting new location.  Also interested in PT -- transportation is barrier.  Would like to see ortho first.      Meds: Current Outpatient Medications  Medication Sig Dispense Refill   albuterol (PROAIR HFA) 108 (90 Base) MCG/ACT inhaler Inhale 2 puffs into the lungs every 4 (four) hours as needed for wheezing or shortness of breath. 18 g 1   cetirizine (ZYRTEC) 10 MG tablet Take 1 tablet (10 mg total) by mouth daily. 90 tablet 3   fluticasone (FLONASE) 50 MCG/ACT nasal spray Place 1 spray into both nostrils daily as needed for allergies or rhinitis. 16 mL 2   methylphenidate (CONCERTA) 36 MG PO CR tablet Take 1 tablet (36 mg total) by mouth daily before breakfast. 30 tablet 0   [START ON 02/07/2022]  methylphenidate (CONCERTA) 36 MG PO CR tablet Take 1 tablet (36 mg total) by mouth daily before breakfast. 30 tablet 0   [START ON 03/09/2022] methylphenidate (CONCERTA) 36 MG PO CR tablet Take 1 tablet (36 mg total) by mouth daily before breakfast. 30 tablet 0   methylphenidate (CONCERTA) 36 MG PO CR tablet Take 1 tablet (36 mg total) by mouth daily before breakfast. 30 tablet 0   Multiple Vitamin tablet Take 1 tablet by mouth daily. 30 tablet 12   No current facility-administered medications for this visit.    ALLERGIES: No Known Allergies  PMH: No past medical history on file.  PSH: No past surgical history on file.  Social history:  Social History   Social History Narrative   Not on file    Family history: No family history on file.   Objective:   Physical Examination:  Pulse: 77 BP: 110/66 (Blood pressure percentiles are not available for patients who are 18 years or older.)  Wt: 163 lb 3.2 oz (74 kg)  Ht: 6' (1.829 m)  BMI: Body mass index is 22.13 kg/m. (47 %ile (Z= -0.09) based on CDC (Boys, 2-20 Years) BMI-for-age based on BMI available as of 10/02/2021 from contact on 10/02/2021.) GENERAL: Well appearing, no distress, more eye contact today, more frequent smiles,otherwise looks down.  HEENT: NCAT, clear sclerae, TMs normal bilaterally, no nasal discharge, no tonsillary erythema or exudate, MMM NECK: Supple,  no cervical LAD LUNGS: EWOB, CTAB, no wheeze, no crackles CARDIO: RRR, normal S1S2 no murmur, well perfused ABDOMEN: Normoactive bowel sounds, soft, ND/NT, no masses or organomegaly EXTREMITIES: Warm and well perfused.  Tingling triggered with palpation of 1st and 2nd digits.  Decreased DIP and PIP flexion in 1st and 2nd fingers.     Assessment/Plan:   Joshua Todd is a 21 y.o. old male here for ADHD follow-up.  Attention and impulsivity well-controlled per history on current Concerta dose.  BP remains appropriate.  Weight slightly downtrending in setting of slightly  decreased appetite, but otherwise no significant side effects on stimulant.   Attention deficit hyperactivity disorder (ADHD), combined type -  Continue Concerta 36 mg daily.  3-mo supply provided today.  - Take Concerta with food.  Focus on nutritiously-dense foods given waning appetite. -  No refill on medication will be given without follow up visit.  S/p hand injury  - Wears glove over hand -- sensitive to touch; tingling feeling  - some improvement in finger flexion, but still not at baseline - Will place new referral to hand specialist today - has missed prior appt x 2   Psychosocial stressors  Concern for food insecurity, housing.  - Interested in sign up process for food stamps.  Will route to Joshua Todd to call him to discuss. Provided handout.  Encounter for planning transition from pediatric to adult care provider Discussed options for adult medical providers - there are 2 internal medicine offices within [facility] that are seeing adult patients with Medicaid - [facility] and [facility] -- patient has no preference  Recommend that patient see an MD/DO and not an APP given complex medical history. I will continue to sign orders for patient until they have been able to establish with an adult medical provider. Chart routed to SW Joshua Todd to follow-up.     Joshua Gash, MD  [facility]  Follow up: Return for no f/u - transitioning to adult care .  I will plan to see him for acute care needs and ADHD follow-up in 3 months if he has not yet connected to an adult provider.

## 2022-01-11 ENCOUNTER — Telehealth: Payer: Self-pay

## 2022-01-11 DIAGNOSIS — Z09 Encounter for follow-up examination after completed treatment for conditions other than malignant neoplasm: Secondary | ICD-10-CM

## 2022-01-11 NOTE — Telephone Encounter (Signed)
SWCM sent patient Adolescent Transition Letter. Letter includes information for [facility] as this is where PCP sent referral for adult Primary Care. SWCM also included info for [facility] as [facility] may not be accepting new pts at this time. SWCM also notified patient that he can call and schedule with [facility] for assistance applying for EBT/SNAP or may apply online at [email or web address] Patient can be seen once more for ADHD follow up to ensure enough time for him to be seen at new adult PCP practice for all future care needs.  Kenn File, BSW, QP Case Manager [facility] Office: [phone] Direct Number: [phone]

## 2022-03-17 ENCOUNTER — Encounter (HOSPITAL_COMMUNITY): Payer: Self-pay

## 2022-03-17 ENCOUNTER — Emergency Department (HOSPITAL_COMMUNITY): Payer: Medicaid Other

## 2022-03-17 ENCOUNTER — Emergency Department (HOSPITAL_COMMUNITY)
Admission: EM | Admit: 2022-03-17 | Discharge: 2022-03-17 | Disposition: A | Discharge status: Home / Self Care | Payer: Medicaid Other | Attending: Emergency Medicine | Admitting: Emergency Medicine

## 2022-03-17 ENCOUNTER — Other Ambulatory Visit: Payer: Self-pay

## 2022-03-17 DIAGNOSIS — M79641 Pain in right hand: Secondary | ICD-10-CM | POA: Insufficient documentation

## 2022-03-17 DIAGNOSIS — S6391XA Sprain of unspecified part of right wrist and hand, initial encounter: Secondary | ICD-10-CM | POA: Diagnosis not present

## 2022-03-17 DIAGNOSIS — Y92410 Unspecified street and highway as the place of occurrence of the external cause: Secondary | ICD-10-CM | POA: Diagnosis not present

## 2022-03-17 DIAGNOSIS — R609 Edema, unspecified: Secondary | ICD-10-CM | POA: Diagnosis not present

## 2022-03-17 DIAGNOSIS — M79646 Pain in unspecified finger(s): Secondary | ICD-10-CM | POA: Diagnosis not present

## 2022-03-17 MED ORDER — IBUPROFEN 400 MG PO TABS
600.0000 mg | ORAL_TABLET | Freq: Once | ORAL | Status: AC
  Administered 2022-03-17: 600 mg via ORAL
  Filled 2022-03-17: qty 1
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
## 2022-03-18 ENCOUNTER — Telehealth: Payer: Self-pay

## 2022-03-18 NOTE — Telephone Encounter (Signed)
Transition Care Management Unsuccessful Follow-up Telephone Call ? ?Date of discharge and from where:  03/17/2022-[place]  ? ?Attempts:  1st Attempt ? ?Reason for unsuccessful TCM follow-up call:  Left voice message ? ?  ?

## 2022-03-19 NOTE — Telephone Encounter (Signed)
Transition Care Management Unsuccessful Follow-up Telephone Call ? ?Date of discharge and from where:  03/17/2022-[facility]  ? ?Attempts:  2nd Attempt ? ?Reason for unsuccessful TCM follow-up call:  Left voice message ? ?  ?

## 2022-03-23 NOTE — Telephone Encounter (Signed)
Transition Care Management Unsuccessful Follow-up Telephone Call ? ?Date of discharge and from where:  03/17/2022-[place]  ? ?Attempts:  3rd Attempt ? ?Reason for unsuccessful TCM follow-up call:  Left voice message ? ?  ?

## 2022-08-24 ENCOUNTER — Ambulatory Visit: Payer: Medicaid Other | Admitting: Critical Care Medicine

## 2022-11-01 ENCOUNTER — Ambulatory Visit: Payer: Self-pay

## 2022-11-01 NOTE — Telephone Encounter (Signed)
Patient's mom called, no answer, no voicemail set up.   Summary: medication needs and NP appt issue   Pt is in need of asthma and ADHD medications / pts mom called and stated she was given the wrong appt / when pts mom called in October a new pt appt was scheduled / the appt was mistakenly scheduled for next November / she called thinking appt was this week and seen it was for 2024/ please advise

## 2022-11-01 NOTE — Telephone Encounter (Signed)
  Chief Complaint: Needs a NP appt Symptoms: Asthma, ADHA medications Frequency: ongoing Pertinent Negatives: Patient denies  Disposition: [] ED /[] Urgent Care (no appt availability in office) / [] Appointment(In office/virtual)/ []  [place] [facility]/ [] Home Care/ [] Refused Recommended Disposition /[] [place] [facility]/ [x]  Follow-up with PCP Additional Notes:Spoke with pt's mother Pt was scheduled for a new pt appt in October. Pt did not  show. Per mother pt was at a psychologist appt.  Pt was rescheduled for NT appt, but appt was made for 12/2022. Pt has ongoing medical issues and cannot wait that long. Mother states that pt has been going to [facility] as a ped since he was born and is not a new pt. She does not want to take pt to a different clinic as this has been his Dr's office all along. Pt needs NP app ASAP and is asking to be worked in for NP appt. PT has asthma and ADHD and  is out of medications. PT has not been seen anywhere since aging out of peds.  Please return call.    Summary: medication needs and NP appt issue     Pt is in need of asthma and ADHD medications / pts mom called and stated she was given the wrong appt / when pts mom called in October a new pt appt was scheduled / the appt was mistakenly scheduled for next November / she called thinking appt was this week and seen it was for 2024/ please advise        Reason for Disposition  [1] Caller requesting NON-URGENT health information AND [2] PCP's office is the best resource  Answer Assessment - Initial Assessment Questions 1. REASON FOR CALL or QUESTION: "What is your reason for calling today?" or "How can I best help you?" or "What question do you have that I can help answer?"     Pt needs a NP appt.  Protocols used: Information Only Call - No Triage-A-AH

## 2022-11-02 NOTE — Telephone Encounter (Signed)
Please reach pout to patient and offer appointment
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
## 2023-01-28 ENCOUNTER — Ambulatory Visit: Payer: Medicaid Other | Admitting: Internal Medicine

## 2023-03-21 ENCOUNTER — Ambulatory Visit: Payer: Medicaid Other | Admitting: Family Medicine

## 2023-09-05 ENCOUNTER — Ambulatory Visit: Payer: Self-pay

## 2023-09-05 NOTE — Telephone Encounter (Signed)
Summary: Question STD   Itching, bumps, burning  ( 1 week)       Chief Complaint: Penis itching, bumps per message. Given number to [facility]. May have been seen there.  Symptoms: Above Frequency:  Pertinent Negatives: Patient denies  Disposition: [] ED /[] Urgent Care (no appt availability in office) / [] Appointment(In office/virtual)/ []  [place] [facility]/ [] Home Care/ [] Refused Recommended Disposition /[] [place] [facility]/ [x]  Follow-up with PCP Additional Notes: Given phone number to [facility].  Reason for Disposition  Patient is worried they have a sexually transmitted infection (STI)  Answer Assessment - Initial Assessment Questions 1. MAIN CONCERN: "What were you exposed to?"  "What sexually transmitted infection (STI) does your sex partner have?" (e.g., gonorrhea, herpes, HIV, pubic lice)     STD - bumps, itching 2. ROUTE of EXPOSURE: "How were you exposed to the STI?" (e.g., oral, vaginal, or rectal intercourse)     Intercourse 3. DATE of EXPOSURE: "When did the exposure occur?" (e.g., days)     Unsure 4. SYMPTOMS: "Do you have any symptoms?" (e.g., pain with urination, rash, sores)     Yes 5. PREGNANCY: "Is there any chance you are pregnant?" "When was your last menstrual period?"     N/a  Protocols used: STI Exposure-A-AH

## 2023-09-09 ENCOUNTER — Encounter (HOSPITAL_COMMUNITY): Payer: Self-pay | Admitting: *Deleted

## 2023-09-09 ENCOUNTER — Ambulatory Visit (HOSPITAL_COMMUNITY)
Admission: EM | Admit: 2023-09-09 | Discharge: 2023-09-09 | Disposition: A | Discharge status: Home / Self Care | Payer: Medicaid Other | Attending: Physician Assistant | Admitting: Physician Assistant

## 2023-09-09 DIAGNOSIS — Z113 Encounter for screening for infections with a predominantly sexual mode of transmission: Secondary | ICD-10-CM | POA: Insufficient documentation

## 2023-09-09 DIAGNOSIS — Z202 Contact with and (suspected) exposure to infections with a predominantly sexual mode of transmission: Secondary | ICD-10-CM | POA: Diagnosis not present

## 2023-09-09 DIAGNOSIS — N489 Disorder of penis, unspecified: Secondary | ICD-10-CM | POA: Insufficient documentation

## 2023-09-09 LAB — HEPATITIS C ANTIBODY: HCV Ab: NONREACTIVE

## 2023-09-09 LAB — HIV ANTIBODY (ROUTINE TESTING W REFLEX): HIV Screen 4th Generation wRfx: NONREACTIVE

## 2023-09-09 MED ORDER — PENICILLIN G BENZATHINE 1200000 UNIT/2ML IM SUSY
2.4000 10*6.[IU] | PREFILLED_SYRINGE | Freq: Once | INTRAMUSCULAR | Status: AC
  Administered 2023-09-09: 2.4 10*6.[IU] via INTRAMUSCULAR

## 2023-09-09 MED ORDER — PENICILLIN G BENZATHINE 1200000 UNIT/2ML IM SUSY
PREFILLED_SYRINGE | INTRAMUSCULAR | Status: AC
  Filled 2023-09-09: qty 4

## 2023-09-09 NOTE — ED Triage Notes (Signed)
Pt states his "baby momma"  tested positive for syphilis a week ago. He states he has sore on his penis x 2 weeks.

## 2023-09-09 NOTE — ED Provider Notes (Signed)
[facility]    CSN: [id number] Arrival date & time: 09/09/23  1225      History   Chief Complaint Chief Complaint  Patient presents with   Exposure to STD    HPI Joshua Todd is a 22 y.o. male.   Patient presents today with a 2-week history of painless sore on his genitals.  He reports that his significant other has been diagnosed with syphilis and he is interested in being tested and treated if appropriate today.  He denies any additional symptoms including discharge, dysuria, fever, nausea, vomiting.  He denies history of STI.  Denies any recent antibiotic use.  He denies history of HSV.  Reports that the lesion is painless and he has not had any additional symptoms.  Denies any flulike symptoms.  He is interested in complete STI panel.    History reviewed. No pertinent past medical history.  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, bilateral 08/03/2020   BMI (body mass index), pediatric, 5% to less than 85% for age 10/03/2020   Central auditory processing disorder 08/03/2020   Failed hearing screening 08/03/2020   Vaccine refused by parent 08/03/2020   Impacted cerumen of right ear 05/03/2018   Mild intermittent asthma without complication 05/03/2018   Seasonal allergies 05/03/2018   Adjustment disorder with mixed anxiety and depressed mood 02/14/2017   Acne 06/02/2016   Constipation 03/13/2014   ADHD (attention deficit hyperactivity disorder), combined type 08/13/2013   Learning disability 08/13/2013   Language disorder involving understanding and expression of language 08/13/2013    History reviewed. No pertinent surgical history.     Home Medications    Prior to  Admission medications   Medication Sig Start Date End Date Taking? Authorizing Provider  albuterol (PROAIR HFA) 108 (90 Base) MCG/ACT inhaler Inhale 2 puffs into the lungs every 4 (four) hours as needed for wheezing or shortness of breath. 08/15/21   Florestine Avers [place], MD  cetirizine (ZYRTEC) 10 MG tablet Take 1 tablet (10 mg total) by mouth daily. 08/15/21   Florestine Avers [place], MD  fluticasone (FLONASE) 50 MCG/ACT nasal spray Place 1 spray into both nostrils daily as needed for allergies or rhinitis. 08/15/21   Florestine Avers [place], MD  Multiple Vitamin tablet Take 1 tablet by mouth daily. 11/12/19   Pritt, Jodelle Gross, MD    Family History Family History  Problem Relation Age of Onset   Asthma Mother    Hypertension Mother    Hypertension Father     Social History Social History   Tobacco Use   Smoking status: Never   Smokeless tobacco: Never  Vaping Use   Vaping status: Never Used  Substance Use Topics   Alcohol use: Yes   Drug use: Yes    Types: Marijuana     Allergies   Patient has no known allergies.   Review of Systems Review of Systems  Constitutional:  Negative for activity change, appetite change, fatigue and fever.  Gastrointestinal:  Negative for abdominal pain, diarrhea, nausea and vomiting.  Genitourinary:  Positive for genital sores. Negative for dysuria, frequency, penile discharge, penile pain and urgency.  Musculoskeletal:  Negative for arthralgias and myalgias.     Physical Exam Triage Vital Signs ED Triage  Vitals  Encounter Vitals Group     BP 09/09/23 1324 (!) 132/98     Systolic BP Percentile --      Diastolic BP Percentile --      Pulse Rate 09/09/23 1324 80     Resp 09/09/23 1324 18     Temp 09/09/23 1324 98.3 F (36.8 C)     Temp Source 09/09/23 1324 Oral     SpO2 09/09/23 1324 98 %     Weight --      Height --      Head Circumference --      Peak Flow --      Pain Score 09/09/23 1321 0     Pain Loc --      Pain Education --      Exclude from Growth  Chart --    No data found.  Updated Vital Signs BP (!) 132/98 (BP Location: Left Arm)   Pulse 80   Temp 98.3 F (36.8 C) (Oral)   Resp 18   SpO2 98%   Visual Acuity Right Eye Distance:   Left Eye Distance:   Bilateral Distance:    Right Eye Near:   Left Eye Near:    Bilateral Near:     Physical Exam Vitals reviewed. Exam conducted with a chaperone present.  Constitutional:      General: He is awake.     Appearance: Normal appearance. He is well-developed. He is not ill-appearing.     Comments: Very pleasant male appears stated age in no acute distress sitting comfortably exam room  HENT:     Head: Normocephalic and atraumatic.     Mouth/Throat:     Pharynx: No oropharyngeal exudate, posterior oropharyngeal erythema or uvula swelling.  Cardiovascular:     Rate and Rhythm: Normal rate and regular rhythm.     Heart sounds: Normal heart sounds, S1 normal and S2 normal. No murmur heard. Pulmonary:     Effort: Pulmonary effort is normal.     Breath sounds: Normal breath sounds. No stridor. No wheezing, rhonchi or rales.     Comments: Clear to auscultation bilaterally Abdominal:     General: Bowel sounds are normal.     Palpations: Abdomen is soft.     Tenderness: There is no abdominal tenderness.  Genitourinary:    Penis: Circumcised. Lesions present.      Comments: Approximately 2 cm x 1 cm ulcerated lesion noted left lateral penile shaft.  Lyla Son, RN present as chaperone during exam. Lymphadenopathy:     Lower Body: Right inguinal adenopathy present. Left inguinal adenopathy present.  Neurological:     Mental Status: He is alert.  Psychiatric:        Behavior: Behavior is cooperative.      UC Treatments / Results  Labs (all labs ordered are listed, but only abnormal results are displayed) Labs Reviewed  HSV CULTURE AND TYPING  HIV ANTIBODY (ROUTINE TESTING W REFLEX)  RPR  HEPATITIS C ANTIBODY  CYTOLOGY, (ORAL, ANAL, URETHRAL) ANCILLARY ONLY     EKG   Radiology No results found.  Procedures Procedures (including critical care time)  Medications Ordered in UC Medications  penicillin g benzathine (BICILLIN LA) 1200000 UNIT/2ML injection 2.4 Million Units (2.4 Million Units Intramuscular Given 09/09/23 1357)    Initial Impression / Assessment and Plan / UC Course  I have reviewed the triage vital signs and the nursing notes.  Pertinent labs & imaging results that were available during my care of the patient were  reviewed by me and considered in my medical decision making (see chart for details).     Patient is well-appearing, afebrile, nontoxic, nontachycardic.  Concern for syphilis given painless chancre but given ulcerated appearance we will also test for HSV.  Given his exposure to syphilis and presentation will empirically treat with 2,400,000 units of penicillin in clinic today.  Discussed that he is not to engage in sexual activity for 1 week after treatment.  Complete STI panel was obtained today and is pending.  We will contact him if we need to arrange additional treatment.  Discussed that if his symptoms are not improving or if anything worsens he needs to return for reevaluation.  Strict return precautions given.  All questions were answered to patient satisfaction.  Final Clinical Impressions(s) / UC Diagnoses   Final diagnoses:  Penile lesion  Exposure to syphilis  Screening examination for STI     Discharge Instructions      We treated you for syphilis.  Please obtain from sex for a minimum of 1 week.  Use a condom with each sexual encounter.  We will contact you if any of your other testing is positive.  If you have any additional symptoms please return for reevaluation.     ED Prescriptions   None    PDMP not reviewed this encounter.   Jeani Hawking, PA-C 09/09/23 1400

## 2023-09-09 NOTE — Discharge Instructions (Signed)
We treated you for syphilis.  Please obtain from sex for a minimum of 1 week.  Use a condom with each sexual encounter.  We will contact you if any of your other testing is positive.  If you have any additional symptoms please return for reevaluation.

## 2023-09-10 LAB — RPR: RPR Ser Ql: NONREACTIVE

## 2023-09-12 ENCOUNTER — Telehealth: Payer: Self-pay

## 2023-09-12 LAB — HSV CULTURE AND TYPING

## 2023-09-12 LAB — CYTOLOGY, (ORAL, ANAL, URETHRAL) ANCILLARY ONLY
Chlamydia: POSITIVE — AB
Comment: NEGATIVE
Comment: NEGATIVE
Comment: NORMAL
Neisseria Gonorrhea: NEGATIVE
Trichomonas: NEGATIVE

## 2023-09-12 MED ORDER — DOXYCYCLINE HYCLATE 100 MG PO CAPS: 100.0000 mg | ORAL_CAPSULE | Freq: Two times a day (BID) | ORAL | 0 refills | Status: DC

## 2023-09-12 NOTE — Telephone Encounter (Signed)
Contacted patient by phone.  Verified identity using two identifiers.  Provided positive result.  Reviewed safe sex practices, notifying partners, and refraining from sexual activities for 7 days from time of treatment.  Patient verified understanding, all questions answered.    Pt requires tx with Doxycycline.  Reviewed with patient, verified pharmacy, prescription sent.

## 2023-10-31 ENCOUNTER — Ambulatory Visit: Payer: Medicaid Other | Admitting: Internal Medicine

## 2024-02-19 ENCOUNTER — Ambulatory Visit (HOSPITAL_COMMUNITY)
Admission: RE | Admit: 2024-02-19 | Discharge: 2024-02-19 | Discharge status: Home / Self Care | Payer: Self-pay | Source: Ambulatory Visit

## 2024-02-19 ENCOUNTER — Encounter (HOSPITAL_COMMUNITY): Payer: Self-pay

## 2024-02-19 VITALS — BP 112/71 | HR 74 | Temp 98.7°F | Resp 16 | Ht 72.0 in | Wt 170.0 lb

## 2024-02-19 DIAGNOSIS — N489 Disorder of penis, unspecified: Secondary | ICD-10-CM | POA: Diagnosis not present

## 2024-02-19 HISTORY — DX: Unspecified asthma, uncomplicated: J45.909

## 2024-02-19 MED ORDER — VALACYCLOVIR HCL 1 G PO TABS
1000.0000 mg | ORAL_TABLET | Freq: Two times a day (BID) | ORAL | 0 refills | Status: AC
Start: 2024-02-19 — End: 2024-02-29

## 2024-02-19 NOTE — ED Provider Notes (Signed)
 [facility]  Note:  This document was prepared using Dragon voice recognition software and may include unintentional dictation errors.  MRN: [id number] DOB: 08/15/2001  Subjective:   Joshua Todd is a 23 y.o. male presenting for penile lesion noted to the shaft of his penis x 1 week.  Patient denies any known exposure to STDs.  Patient reports that initially the lesion was a blister that ruptured having clear fluid but now lesion is dry.  No other secondary lesions noted, no penile discharge, testicular pain, abdominal pain, flank pain, no dysuria, no increased urinary frequency.  Denies any history of HSV infection.  No current facility-administered medications for this encounter.  Current Outpatient Medications:    valACYclovir (VALTREX) 1000 MG tablet, Take 1 tablet (1,000 mg total) by mouth 2 (two) times daily for 10 days., Disp: 20 tablet, Rfl: 0   albuterol (PROAIR HFA) 108 (90 Base) MCG/ACT inhaler, Inhale 2 puffs into the lungs every 4 (four) hours as needed for wheezing or shortness of breath., Disp: 18 g, Rfl: 1   cetirizine (ZYRTEC) 10 MG tablet, Take 1 tablet (10 mg total) by mouth daily., Disp: 90 tablet, Rfl: 3   doxycycline (VIBRAMYCIN) 100 MG capsule, Take 1 capsule (100 mg total) by mouth 2 (two) times daily., Disp: 14 capsule, Rfl: 0   fluticasone (FLONASE) 50 MCG/ACT nasal spray, Place 1 spray into both nostrils daily as needed for allergies or rhinitis., Disp: 16 mL, Rfl: 2   Multiple Vitamin tablet, Take 1 tablet by mouth daily., Disp: 30 tablet, Rfl: 12   No Known Allergies  Past Medical History:  Diagnosis Date   Asthma      Past Surgical History:  Procedure Laterality Date   TESTICLE SURGERY      Family History  Problem Relation Age of Onset   Asthma Mother    Hypertension Mother    Hypertension Father     Social History   Tobacco Use   Smoking status: Never   Smokeless tobacco: Never  Vaping Use   Vaping status: Never Used   Substance Use Topics   Alcohol use: Yes   Drug use: Yes    Types: Marijuana    ROS Refer to HPI for ROS details.  Objective:   Vitals: BP 112/71 (BP Location: Left Arm)   Pulse 74   Temp 98.7 F (37.1 C) (Oral)   Resp 16   Ht 6' (1.829 m)   Wt 170 lb (77.1 kg)   SpO2 97%   BMI 23.06 kg/m   Physical Exam Vitals and nursing note reviewed.  Constitutional:      General: He is not in acute distress.    Appearance: Normal appearance. He is well-developed. He is not ill-appearing or toxic-appearing.  HENT:     Head: Normocephalic.  Cardiovascular:     Rate and Rhythm: Normal rate.  Pulmonary:     Effort: Pulmonary effort is normal. No respiratory distress.  Abdominal:     General: There is no distension.     Palpations: Abdomen is soft.     Tenderness: There is no abdominal tenderness. There is no guarding or rebound.  Genitourinary:    Penis: Uncircumcised. Lesions present. No erythema, tenderness, discharge or swelling.   Skin:    General: Skin is warm and dry.  Neurological:     General: No focal deficit present.     Mental Status: He is alert and oriented to person, place, and time.  Psychiatric:  Mood and Affect: Mood normal.     Procedures  No results found for this or any previous visit (from the past 24 hours).  Assessment and Plan :   PDMP not reviewed this encounter.  1. Penile lesion    Suspected herpes simplex virus -HSV swab collected and UC results pending, should be available in 24 to 48 hours.  If positive patient will be contacted with appropriate management. -Prescription sent for Valtrex 1000 mg twice daily x 10 days for initial treatment of herpes simplex virus.  Patient advised not to start medication until positive test confirmation. -Patient advised to discontinue sexual contact until symptoms resolve and confirmed negative testing. -Continue to monitor symptoms for any change in severity if there is any escalation of symptoms  follow-up for further evaluation management  Mirna Mires B, NP 02/19/24 1716

## 2024-02-19 NOTE — ED Triage Notes (Signed)
 Chief Complaint: Patient has sores on the groin, lower abdominal pain. Slight cough that has resolved.   Sick exposure: No  Onset: 1 week   Prescriptions or OTC medications tried: No    New foods, medications, or products: No  Recent Travel: No

## 2024-02-19 NOTE — Discharge Instructions (Signed)
 Suspected herpes simplex virus -HSV swab collected and UC results pending, should be available in 24 to 48 hours.  If positive patient will be contacted with appropriate management. -Prescription sent for Valtrex 1000 mg twice daily x 10 days for initial treatment of herpes simplex virus.  Patient advised not to start medication until positive test confirmation. -Patient advised to discontinue sexual contact until symptoms resolve and confirmed negative testing. -Continue to monitor symptoms for any change in severity if there is any escalation of symptoms follow-up for further evaluation management

## 2024-02-20 LAB — HSV 1/2 PCR (SURFACE)
HSV-1 DNA: NOT DETECTED
HSV-2 DNA: DETECTED — AB

## 2024-02-22 LAB — HSV CULTURE AND TYPING

## 2024-03-23 ENCOUNTER — Telehealth (HOSPITAL_COMMUNITY): Payer: Self-pay

## 2024-03-23 NOTE — Telephone Encounter (Signed)
 Pt called in response to letter sent to home. Advised of HSV results from 02/19/24. Questions answered. Phone number updated in chart.

## 2024-05-27 ENCOUNTER — Ambulatory Visit (HOSPITAL_COMMUNITY): Payer: Self-pay

## 2024-06-08 ENCOUNTER — Ambulatory Visit (HOSPITAL_COMMUNITY): Payer: Self-pay

## 2024-06-11 ENCOUNTER — Ambulatory Visit (HOSPITAL_COMMUNITY)
Admission: RE | Admit: 2024-06-11 | Discharge: 2024-06-11 | Disposition: A | Discharge status: Home / Self Care | Source: Ambulatory Visit | Attending: Family Medicine | Admitting: Family Medicine

## 2024-06-11 ENCOUNTER — Encounter (HOSPITAL_COMMUNITY): Payer: Self-pay

## 2024-06-11 VITALS — BP 124/75 | HR 61 | Temp 98.0°F | Resp 18

## 2024-06-11 DIAGNOSIS — Z202 Contact with and (suspected) exposure to infections with a predominantly sexual mode of transmission: Secondary | ICD-10-CM | POA: Insufficient documentation

## 2024-06-11 MED ORDER — METRONIDAZOLE 500 MG PO TABS: 2000.0000 mg | ORAL_TABLET | Freq: Once | ORAL | 0 refills | Status: AC

## 2024-06-11 NOTE — ED Triage Notes (Signed)
 Patient reports he was expose to [facility] and would like to be treated. Patient denies any symptoms at this time.

## 2024-06-11 NOTE — ED Provider Notes (Signed)
 [facility]    CSN: [id number] Arrival date & time: 06/11/24  1504      History   Chief Complaint Chief Complaint  Patient presents with   Exposure to STD    Entered by patient    HPI Joshua Todd is a 23 y.o. male.    Exposure to STD  Here for exposure to trichomonas. He does not have any penile discharge or dysuria or itching.  His partner has tested positive for trichomonas and he requests treatment.  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, bilateral 08/03/2020   BMI (body mass index), pediatric, 5% to less than 85% for age 53/29/2021   Central auditory processing disorder 08/03/2020   Failed hearing screening 08/03/2020   Vaccine refused by parent 08/03/2020   Impacted cerumen of right ear 05/03/2018   Mild intermittent asthma without complication 05/03/2018   Seasonal allergies 05/03/2018   Adjustment disorder with mixed anxiety and depressed mood 02/14/2017   Acne 06/02/2016   Constipation 03/13/2014   ADHD (attention deficit hyperactivity disorder), combined type 08/13/2013   Learning disability 08/13/2013   Language disorder involving understanding and expression of language 08/13/2013    Past Surgical History:  Procedure Laterality Date   TESTICLE SURGERY         Home Medications    Prior to Admission medications   Medication Sig Start Date End Date Taking? Authorizing Provider  metroNIDAZOLE  (FLAGYL ) 500 MG tablet Take 4 tablets (2,000 mg total) by mouth once for 1 dose. 06/11/24 06/11/24 Yes Vonna Sharlet POUR, MD  albuterol  (PROAIR  HFA) 108 [phone] Base) MCG/ACT inhaler Inhale 2 puffs into the lungs  every 4 (four) hours as needed for wheezing or shortness of breath. 08/15/21   Kenney [place], MD  cetirizine  (ZYRTEC ) 10 MG tablet Take 1 tablet (10 mg total) by mouth daily. 08/15/21   Kenney [place], MD  Multiple Vitamin tablet Take 1 tablet by mouth daily. 11/12/19   Pritt, Nat HERO, MD    Family History Family History  Problem Relation Age of Onset   Asthma Mother    Hypertension Mother    Hypertension Father     Social History Social History   Tobacco Use   Smoking status: Never   Smokeless tobacco: Never  Vaping Use   Vaping status: Never Used  Substance Use Topics   Alcohol use: Yes   Drug use: Yes    Types: Marijuana     Allergies   Patient has no known allergies.   Review of Systems Review of Systems   Physical Exam Triage Vital Signs ED Triage Vitals  Encounter Vitals Group     BP 06/11/24 1601 124/75     Girls Systolic BP Percentile --      Girls Diastolic BP Percentile --      Boys Systolic BP Percentile --      Boys Diastolic BP Percentile --      Pulse Rate 06/11/24 1601 61     Resp 06/11/24 1601 18     Temp 06/11/24 1601 98 F (36.7 C)     Temp Source 06/11/24 1601 Oral  SpO2 06/11/24 1601 97 %     Weight --      Height --      Head Circumference --      Peak Flow --      Pain Score 06/11/24 1602 0     Pain Loc --      Pain Education --      Exclude from Growth Chart --    No data found.  Updated Vital Signs BP 124/75 (BP Location: Left Arm)   Pulse 61   Temp 98 F (36.7 C) (Oral)   Resp 18   SpO2 97%   Visual Acuity Right Eye Distance:   Left Eye Distance:   Bilateral Distance:    Right Eye Near:   Left Eye Near:    Bilateral Near:     Physical Exam Vitals reviewed.  Constitutional:      General: He is not in acute distress.    Appearance: He is not ill-appearing, toxic-appearing or diaphoretic.  Skin:    Coloration: Skin is not pale.  Neurological:     Mental Status: He is alert and oriented to person, place, and  time.  Psychiatric:        Behavior: Behavior normal.      UC Treatments / Results  Labs (all labs ordered are listed, but only abnormal results are displayed) Labs Reviewed  CYTOLOGY, (ORAL, ANAL, URETHRAL) ANCILLARY ONLY    EKG   Radiology No results found.  Procedures Procedures (including critical care time)  Medications Ordered in UC Medications - No data to display  Initial Impression / Assessment and Plan / UC Course  I have reviewed the triage vital signs and the nursing notes.  Pertinent labs & imaging results that were available during my care of the patient were reviewed by me and considered in my medical decision making (see chart for details).     Urethral self swab is done and staff will notify him of any positives and treat per protocol. Metronidazole  is sent in to treat the trichomonas. Final Clinical Impressions(s) / UC Diagnoses   Final diagnoses:  Exposure to STD     Discharge Instructions      Staff will notify you if there is anything positive on the swab.  Take metronidazole  500 mg-- 4 tablets taken by mouth 1 time.  Avoid drinking alcohol within 72 hours of taking this medication      ED Prescriptions     Medication Sig Dispense Auth. Provider   metroNIDAZOLE  (FLAGYL ) 500 MG tablet Take 4 tablets (2,000 mg total) by mouth once for 1 dose. 4 tablet Tyrea Froberg, Sharlet POUR, MD      PDMP not reviewed this encounter.   Vonna Sharlet POUR, MD 06/11/24 [phone]

## 2024-06-11 NOTE — Discharge Instructions (Addendum)
 Staff will notify you if there is anything positive on the swab.  Take metronidazole  500 mg-- 4 tablets taken by mouth 1 time.  Avoid drinking alcohol within 72 hours of taking this medication

## 2024-06-12 LAB — CYTOLOGY, (ORAL, ANAL, URETHRAL) ANCILLARY ONLY
Chlamydia: NEGATIVE
Comment: NEGATIVE
Comment: NEGATIVE
Comment: NORMAL
Neisseria Gonorrhea: NEGATIVE
Trichomonas: NEGATIVE

## 2024-08-27 ENCOUNTER — Ambulatory Visit (HOSPITAL_COMMUNITY): Payer: Self-pay

## 2024-12-26 ENCOUNTER — Ambulatory Visit (HOSPITAL_COMMUNITY): Payer: Self-pay
